# Patient Record
Sex: Male | Born: 1965 | Race: Black or African American | Hispanic: No | Marital: Single | Smoking: Never smoker
Health system: Southern US, Community
[De-identification: ages and names within clinical notes are randomized; demographics above are authoritative.]

## PROBLEM LIST (undated history)

## (undated) DIAGNOSIS — M25569 Pain in unspecified knee: Secondary | ICD-10-CM

---

## 2019-04-14 ENCOUNTER — Other Ambulatory Visit: Payer: Self-pay

## 2019-04-14 ENCOUNTER — Emergency Department (HOSPITAL_COMMUNITY)
Admission: EM | Admit: 2019-04-14 | Discharge: 2019-04-14 | Disposition: A | Discharge status: Home / Self Care | Payer: Self-pay | Attending: Emergency Medicine | Admitting: Emergency Medicine

## 2019-04-14 ENCOUNTER — Encounter (HOSPITAL_COMMUNITY): Payer: Self-pay

## 2019-04-14 DIAGNOSIS — G8929 Other chronic pain: Secondary | ICD-10-CM | POA: Insufficient documentation

## 2019-04-14 DIAGNOSIS — M25561 Pain in right knee: Secondary | ICD-10-CM | POA: Insufficient documentation

## 2019-04-14 MED ORDER — IBUPROFEN 600 MG PO TABS: 600.0000 mg | ORAL_TABLET | Freq: Four times a day (QID) | ORAL | 0 refills | Status: DC | PRN

## 2019-04-14 NOTE — ED Provider Notes (Signed)
  [facility] DEPT Provider Note   CSN: [id number] Arrival date & time: 04/14/19  1149     History   Chief Complaint Chief Complaint  Patient presents with  . Knee Pain    right    HPI Tobi Leinweber is a 53 y.o. male.     53 year old male presents with chronic right knee pain.  Denies any new injury.  Pain characterizes sharp and worse on lower lateral aspect of his right knee.  Denies any new swelling.  No fever or chills.  No other joint discomfort.  Has used Motrin in the past which is helped but has run out.     History reviewed. No pertinent past medical history.  There are no active problems to display for this patient.   History reviewed. No pertinent surgical history.      Home Medications    Prior to Admission medications   Not on File    Family History No family history on file.  Social History Social History   Tobacco Use  . Smoking status: Not on file  Substance Use Topics  . Alcohol use: Not on file  . Drug use: Not on file     Allergies   Patient has no known allergies.   Review of Systems Review of Systems  All other systems reviewed and are negative.    Physical Exam Updated Vital Signs BP (!) 158/93   Pulse 94   Temp 98.1 F (36.7 C) (Oral)   Resp 14   Wt 97.7 kg   SpO2 100%   Physical Exam Vitals signs and nursing note reviewed.  Constitutional:      Appearance: He is well-developed. He is not toxic-appearing.  HENT:     Head: Normocephalic and atraumatic.  Eyes:     Conjunctiva/sclera: Conjunctivae normal.     Pupils: Pupils are equal, round, and reactive to light.  Neck:     Musculoskeletal: Normal range of motion.  Cardiovascular:     Rate and Rhythm: Normal rate.  Pulmonary:     Effort: Pulmonary effort is normal.  Musculoskeletal:     Right knee: He exhibits normal range of motion, no swelling and no effusion.       Legs:  Skin:    General: Skin is warm and dry.   Neurological:     Mental Status: He is alert and oriented to person, place, and time.      ED Treatments / Results  Labs (all labs ordered are listed, but only abnormal results are displayed) Labs Reviewed - No data to display  EKG None  Radiology No results found.  Procedures Procedures (including critical care time)  Medications Ordered in ED Medications - No data to display   Initial Impression / Assessment and Plan / ED Course  I have reviewed the triage vital signs and the nursing notes.  Pertinent labs & imaging results that were available during my care of the patient were reviewed by me and considered in my medical decision making (see chart for details).        Patient with chronic atraumatic right knee pain.  Suspect osteoarthritis and will prescribe ibuprofen and give orthopedic referral  Final Clinical Impressions(s) / ED Diagnoses   Final diagnoses:  None    ED Discharge Orders    None       Lacretia Leigh, MD 04/14/19 1225

## 2019-04-14 NOTE — ED Triage Notes (Signed)
Pt c/o right knee pain, chronic since 1996. Pt states that he has had swelling on that knee. Pt was told that he may need surgery on it.

## 2019-09-05 ENCOUNTER — Other Ambulatory Visit: Payer: Self-pay

## 2019-09-05 ENCOUNTER — Emergency Department (HOSPITAL_COMMUNITY): Payer: Self-pay

## 2019-09-05 ENCOUNTER — Encounter (HOSPITAL_COMMUNITY): Payer: Self-pay | Admitting: Emergency Medicine

## 2019-09-05 ENCOUNTER — Emergency Department (HOSPITAL_COMMUNITY)
Admission: EM | Admit: 2019-09-05 | Discharge: 2019-09-05 | Disposition: A | Discharge status: Home / Self Care | Payer: Self-pay | Attending: Emergency Medicine | Admitting: Emergency Medicine

## 2019-09-05 DIAGNOSIS — M1711 Unilateral primary osteoarthritis, right knee: Secondary | ICD-10-CM | POA: Insufficient documentation

## 2019-09-05 HISTORY — DX: Pain in unspecified knee: M25.569

## 2019-09-05 MED ORDER — ACETAMINOPHEN 500 MG PO TABS: 500.0000 mg | ORAL_TABLET | Freq: Four times a day (QID) | ORAL | 0 refills | Status: AC | PRN

## 2019-09-05 MED ORDER — MELOXICAM 7.5 MG PO TABS: 7.5000 mg | ORAL_TABLET | Freq: Every day | ORAL | 0 refills | Status: AC

## 2019-09-05 MED ORDER — DICLOFENAC SODIUM 1 % EX GEL: 2.0000 g | Freq: Four times a day (QID) | CUTANEOUS | 0 refills | Status: AC

## 2019-09-05 NOTE — ED Provider Notes (Signed)
[facility] DEPART[facility] Provider Note   CSN: [id number] Arrival date & time: 09/05/19  8341     History   Chief Complaint Chief Complaint  Patient presents with  . Knee Pain    HPI Shane Anthony is a 53 y.o. male.     HPI  Patient presents today with flareup of chronic knee pain that has been constant, achy, mild pain that is worse at the end of his workday.  This pain is exacerbation of his chronic right knee pain which she has had for 6 years since an injury that he sustained at work.  Patient states that he occasionally has popping and clicking in his knee.  Denies any new injury.  Denies any sudden onset of pain and states that is flareup has been gradual in onset.  Patient states he works primarily on his feet for long shifts early morning.  Denies any pain that is worse at night.  Denies any episodes of his knee giving out causing him to fall.  Denies any bruising, swelling, redness, fevers, chills, history of septic joints, history of surgery on knee.  Patient was seen several months ago and given ibuprofen for his symptoms which he states have not helped.  Patient has not taken any medications today.  Uses a compression sleeve at work.  Past Medical History:  Diagnosis Date  . Knee pain     There are no active problems to display for this patient.   History reviewed. No pertinent surgical history.      Home Medications    Prior to Admission medications   Medication Sig Start Date End Date Taking? Authorizing Provider  acetaminophen (TYLENOL) 500 MG tablet Take 1 tablet (500 mg total) by mouth every 6 (six) hours as needed. 09/05/19   Tedd Sias, PA  diclofenac Sodium (VOLTAREN) 1 % GEL Apply 2 g topically 4 (four) times daily. 09/05/19   Tedd Sias, PA  meloxicam (MOBIC) 7.5 MG tablet Take 1 tablet (7.5 mg total) by mouth daily for 14 days. 09/05/19 09/19/19  Tedd Sias, PA    Family History No family history on  file.  Social History Social History   Tobacco Use  . Smoking status: Never Smoker  . Smokeless tobacco: Never Used  Substance Use Topics  . Alcohol use: Not Currently  . Drug use: Not Currently     Allergies   Patient has no known allergies.   Review of Systems Review of Systems  Constitutional: Negative for chills and fever.  HENT: Negative for congestion.   Respiratory: Negative for shortness of breath.   Cardiovascular: Negative for chest pain.  Gastrointestinal: Negative for abdominal pain.  Musculoskeletal: Negative for neck pain.       Right knee pain   Skin: Negative for rash and wound.     Physical Exam Updated Vital Signs BP 139/84 (BP Location: Left Arm)   Pulse 80   Temp 98.7 F (37.1 C) (Oral)   Resp 16   SpO2 100%   Physical Exam Vitals signs and nursing note reviewed.  Constitutional:      General: He is not in acute distress.    Appearance: Normal appearance. He is not ill-appearing.  HENT:     Head: Normocephalic and atraumatic.  Eyes:     General: No scleral icterus.       Right eye: No discharge.        Left eye: No discharge.     Conjunctiva/sclera: Conjunctivae  normal.  Pulmonary:     Effort: Pulmonary effort is normal.     Breath sounds: No stridor.  Musculoskeletal:       Legs:  Skin:    Comments: No erythema, cellulitis or abscess bruising or induration of knee or surrounding skin   Neurological:     Mental Status: He is alert and oriented to person, place, and time. Mental status is at baseline.     Comments: Sensation intact bilateral lower extremities.  Gait normal.      ED Treatments / Results  Labs (all labs ordered are listed, but only abnormal results are displayed) Labs Reviewed - No data to display  EKG None  Radiology Dg Knee Complete 4 Views Right  Result Date: 09/05/2019 CLINICAL DATA:  Evaluate knee pain. EXAM: RIGHT KNEE - COMPLETE 4+ VIEW COMPARISON:  None. FINDINGS: No evidence of fracture,  dislocation, or joint effusion. Sharpening of the tibial spines noted. Soft tissues are unremarkable. IMPRESSION: 1. No acute findings. 2. Mild osteoarthritis. Electronically Signed   By: Signa Kell M.D.   On: 09/05/2019 10:39    Procedures Procedures (including critical care time)  Medications Ordered in ED Medications - No data to display   Initial Impression / Assessment and Plan / ED Course  I have reviewed the triage vital signs and the nursing notes.  Pertinent labs & imaging results that were available during my care of the patient were reviewed by me and considered in my medical decision making (see chart for details).        Patient is 53 year old male with a history of chronic right knee pain after injury that occurred years ago.  Patient presents today with no new injury.  Presents with "flareup "his chronic knee pain.  States he is able to ambulate but complains of pain in lateral right knee and inferior/anterior knee joint after long day of working.  Denies any swelling.  There is no erythema, warmth or painful range of motion that would indicate septic joint.  Patient denies any recent trauma.  I doubt there is any fracture.  Suspect that this is osteoarthritic or chronic tendon injury with acute flareup.  Patient has used some ibuprofen without relief.  Has not used any medications today.  Will obtain x-ray to rule out effusion that was not palpable my exam and patient has not had x-rays of his knee in our system.  Patient has no history of gout low suspicion for this as there is no swelling and pain is not centralized in the knee joint but rather below and lateral to the knee.  Doubt tibial plateau fracture as there is no history to support this.  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  Patient has intact pulses and sensation in bilateral lower extremity.  Doubt that there is any arterial injury.   There is no calf tenderness or swelling of extremity has no history of blood clots.  Doubt DVT.  Suspect this is acute on chronic pain.  Patient was seen several months ago and discharge of ibuprofen and recommendation to follow-up with orthopedics.  Patient states he has not follow-up with orthopedics.  X-ray of right knee without acute bony abnormality or evidence of effusion.  Will discharge patient with meloxicam and topical therapy.  Recommend Tylenol use.  Patient understanding of plan.  Final Clinical Impressions(s) / ED Diagnoses   Final diagnoses:  Osteoarthritis of right knee, unspecified osteoarthritis  type    ED Discharge Orders         Ordered    meloxicam (MOBIC) 7.5 MG tablet  Daily     09/05/19 1115    diclofenac Sodium (VOLTAREN) 1 % GEL  4 times daily     09/05/19 1115    acetaminophen (TYLENOL) 500 MG tablet  Every 6 hours PRN     09/05/19 1115           Gailen ShelterFondaw, Jennessy Sandridge S, [place] 09/05/19 1116    Terald Sleeperrifan, Matthew J, MD 09/05/19 2141

## 2019-09-05 NOTE — ED Triage Notes (Signed)
C/o chronic R knee pain that has been flared-up x 2 weeks.  Denies recent injury.

## 2019-09-05 NOTE — Discharge Instructions (Addendum)
Please follow-up with primary care provider for reassessment if needed.  You may depending on provider preference to be able to have steroid injection done there.  You may want to call ahead of time.  If this is not available you may follow-up with [facility] for evaluation.  Please use compression wrap for leg.  Rest ice and elevate.  Use Tylenol and meloxicam.  I have also prescribed you a topical medication that can help with pain.

## 2021-03-02 IMAGING — CR DG KNEE COMPLETE 4+V*R*
4 series · 4 of 4 positions shown · non-contrast
Comparison: None.

CLINICAL DATA: Evaluate knee pain.

EXAM:
RIGHT KNEE - COMPLETE 4+ VIEW

[knee ap]
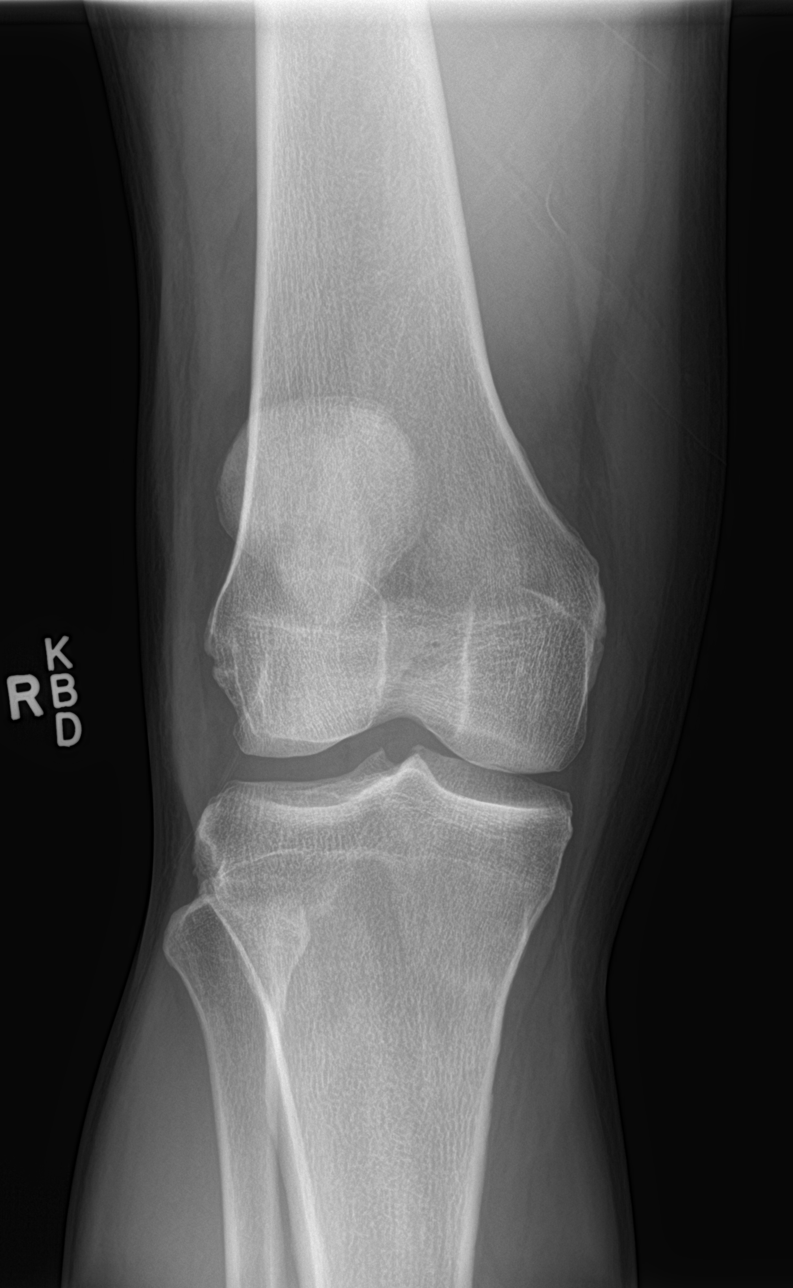

[knee lat]
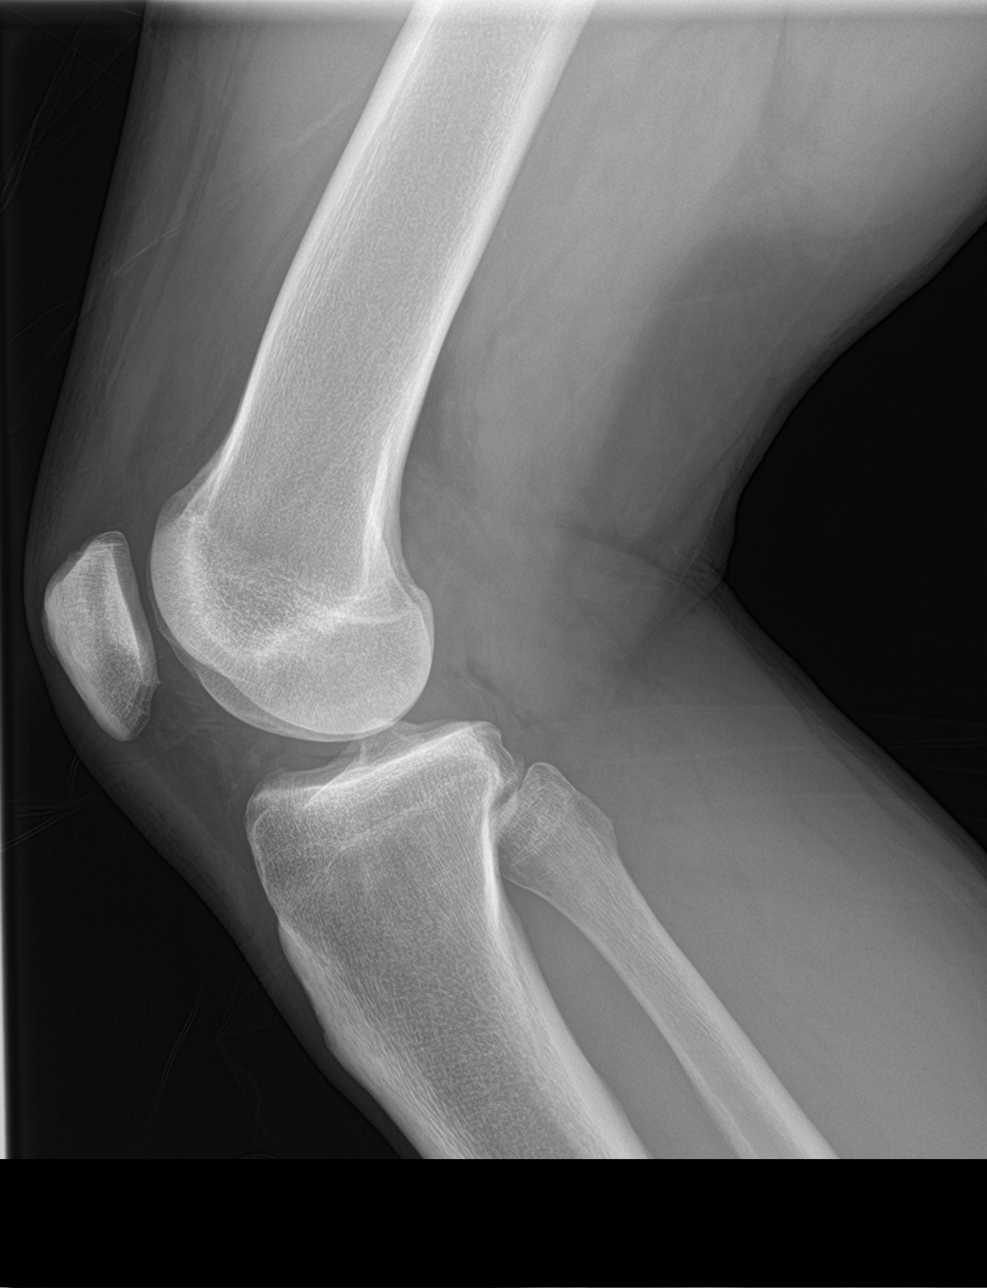

[knee obl (1 of 2)]
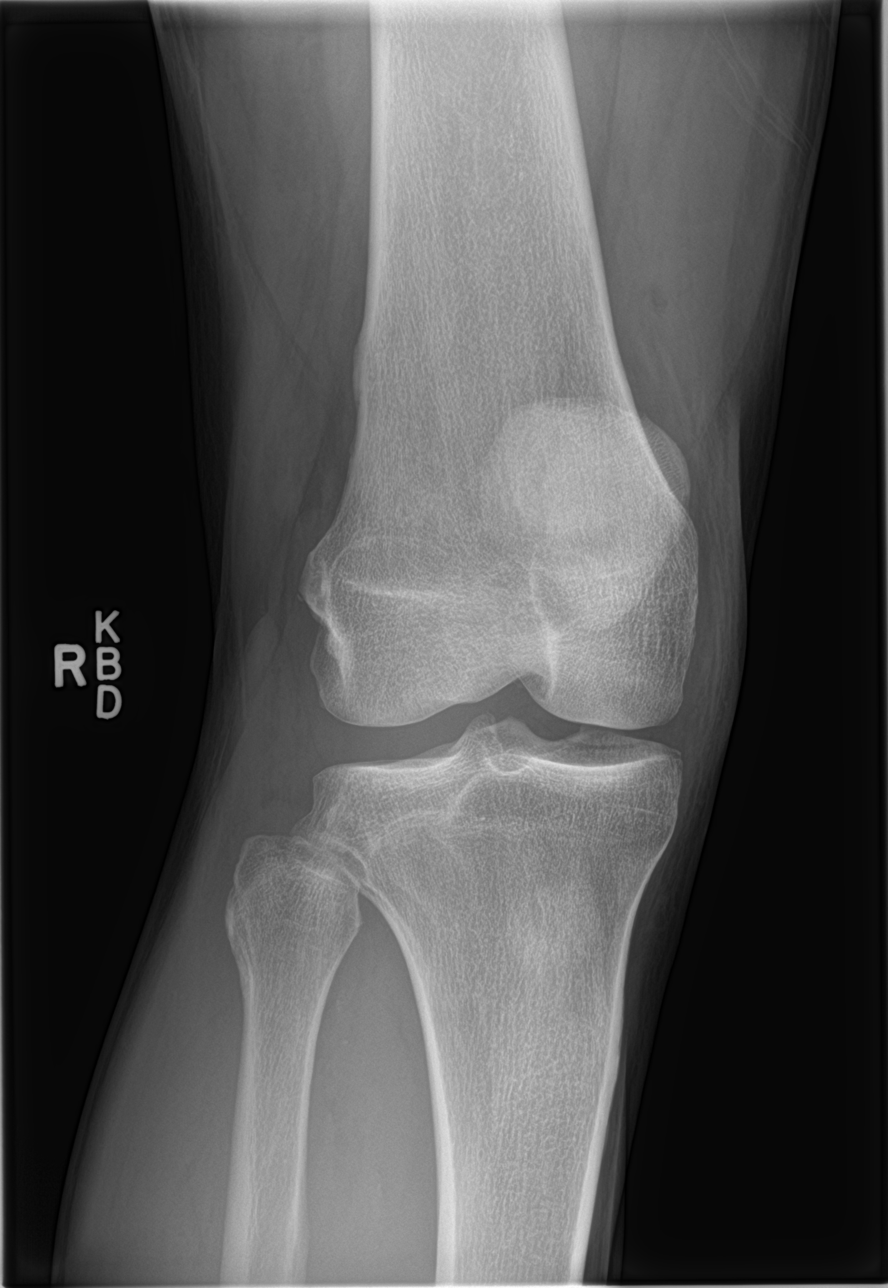

[knee obl (2 of 2)]
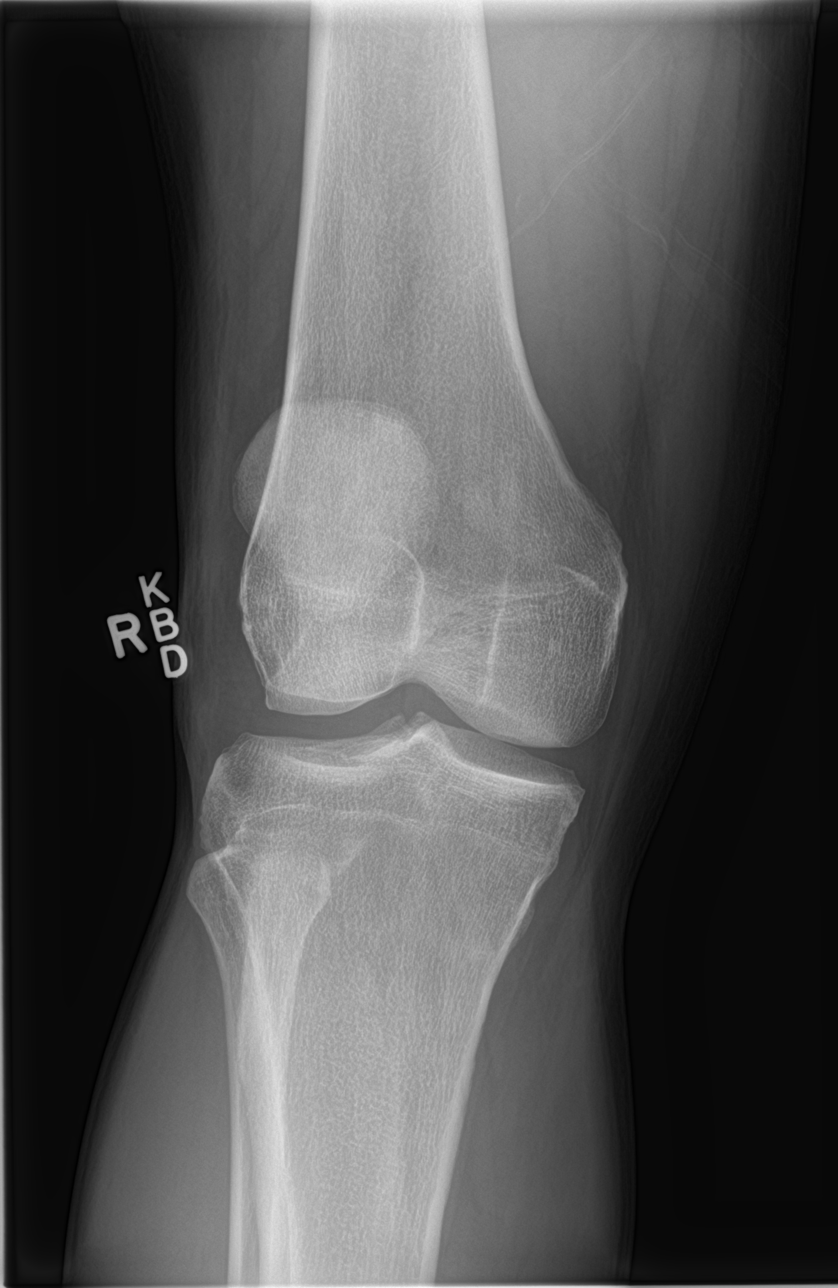

[4 of 4 positions shown; findings below may reference images not displayed]

FINDINGS: No evidence of fracture, dislocation, or joint effusion. Sharpening
of the tibial spines noted. Soft tissues are unremarkable.
IMPRESSION: 1. No acute findings.
2. Mild osteoarthritis.
